# Patient Record
Sex: Male | Born: 1985 | Race: White | Hispanic: No | Marital: Single | Smoking: Current some day smoker
Health system: Southern US, Community
[De-identification: ages and names within clinical notes are randomized; demographics above are authoritative.]

## PROBLEM LIST (undated history)

## (undated) DIAGNOSIS — N200 Calculus of kidney: Secondary | ICD-10-CM

## (undated) HISTORY — DX: Calculus of kidney: N20.0

## (undated) HISTORY — PX: WISDOM TOOTH EXTRACTION: SHX21

## (undated) HISTORY — PX: HAND SURGERY: SHX662

## (undated) SURGERY — Surgical Case
Anesthesia: *Unknown

---

## 2006-07-08 ENCOUNTER — Observation Stay: Payer: Self-pay | Admitting: Unknown Physician Specialty

## 2014-11-24 ENCOUNTER — Inpatient Hospital Stay (HOSPITAL_COMMUNITY)
Admission: EM | Admit: 2014-11-24 | Discharge: 2014-11-28 | DRG: 964 | Disposition: A | Discharge status: Home / Self Care | Payer: No Typology Code available for payment source | Attending: General Surgery | Admitting: General Surgery

## 2014-11-24 ENCOUNTER — Encounter (HOSPITAL_COMMUNITY): Payer: Self-pay | Admitting: Cardiology

## 2014-11-24 ENCOUNTER — Emergency Department: Payer: Self-pay | Admitting: Emergency Medicine

## 2014-11-24 DIAGNOSIS — S2231XA Fracture of one rib, right side, initial encounter for closed fracture: Secondary | ICD-10-CM | POA: Diagnosis present

## 2014-11-24 DIAGNOSIS — S36114A Minor laceration of liver, initial encounter: Secondary | ICD-10-CM | POA: Diagnosis present

## 2014-11-24 DIAGNOSIS — S270XXA Traumatic pneumothorax, initial encounter: Secondary | ICD-10-CM | POA: Diagnosis present

## 2014-11-24 DIAGNOSIS — S36031A Moderate laceration of spleen, initial encounter: Secondary | ICD-10-CM | POA: Diagnosis present

## 2014-11-24 DIAGNOSIS — S32029A Unspecified fracture of second lumbar vertebra, initial encounter for closed fracture: Secondary | ICD-10-CM | POA: Diagnosis present

## 2014-11-24 DIAGNOSIS — S060XAA Concussion with loss of consciousness status unknown, initial encounter: Secondary | ICD-10-CM | POA: Diagnosis present

## 2014-11-24 DIAGNOSIS — S060X9A Concussion with loss of consciousness of unspecified duration, initial encounter: Secondary | ICD-10-CM | POA: Diagnosis present

## 2014-11-24 DIAGNOSIS — S36113A Laceration of liver, unspecified degree, initial encounter: Secondary | ICD-10-CM

## 2014-11-24 DIAGNOSIS — S36039A Unspecified laceration of spleen, initial encounter: Secondary | ICD-10-CM | POA: Diagnosis present

## 2014-11-24 DIAGNOSIS — S32009A Unspecified fracture of unspecified lumbar vertebra, initial encounter for closed fracture: Secondary | ICD-10-CM | POA: Diagnosis present

## 2014-11-24 DIAGNOSIS — D62 Acute posthemorrhagic anemia: Secondary | ICD-10-CM | POA: Diagnosis not present

## 2014-11-24 LAB — CBC WITH DIFFERENTIAL/PLATELET
Basophils Absolute: 0 10*3/uL (ref 0.0–0.1)
Basophils Relative: 0 % (ref 0–1)
Eosinophils Absolute: 0 10*3/uL (ref 0.0–0.7)
Eosinophils Relative: 0 % (ref 0–5)
HCT: 39.2 % (ref 39.0–52.0)
HEMOGLOBIN: 13.9 g/dL (ref 13.0–17.0)
Lymphocytes Relative: 7 % — ABNORMAL LOW (ref 12–46)
Lymphs Abs: 0.8 10*3/uL (ref 0.7–4.0)
MCH: 31.1 pg (ref 26.0–34.0)
MCHC: 35.5 g/dL (ref 30.0–36.0)
MCV: 87.7 fL (ref 78.0–100.0)
Monocytes Absolute: 0.8 10*3/uL (ref 0.1–1.0)
Monocytes Relative: 7 % (ref 3–12)
NEUTROS ABS: 10.2 10*3/uL — AB (ref 1.7–7.7)
NEUTROS PCT: 86 % — AB (ref 43–77)
PLATELETS: 166 10*3/uL (ref 150–400)
RBC: 4.47 MIL/uL (ref 4.22–5.81)
RDW: 12.3 % (ref 11.5–15.5)
WBC: 11.7 10*3/uL — ABNORMAL HIGH (ref 4.0–10.5)

## 2014-11-24 LAB — URINALYSIS, ROUTINE W REFLEX MICROSCOPIC
Bilirubin Urine: NEGATIVE
GLUCOSE, UA: NEGATIVE mg/dL
Ketones, ur: NEGATIVE mg/dL
Leukocytes, UA: NEGATIVE
Nitrite: NEGATIVE
PH: 8.5 — AB (ref 5.0–8.0)
Protein, ur: NEGATIVE mg/dL
Specific Gravity, Urine: >1.046 — ABNORMAL HIGH (ref 1.005–1.030)
Urobilinogen, UA: 1 mg/dL (ref 0.0–1.0)

## 2014-11-24 LAB — COMPREHENSIVE METABOLIC PANEL
ALK PHOS: 71 U/L (ref 39–117)
ALT: 133 U/L — ABNORMAL HIGH (ref 0–53)
AST: 139 U/L — ABNORMAL HIGH (ref 0–37)
Albumin: 3.8 g/dL (ref 3.5–5.2)
Anion gap: 8 (ref 5–15)
BILIRUBIN TOTAL: 0.4 mg/dL (ref 0.3–1.2)
BUN: 6 mg/dL (ref 6–23)
CO2: 28 mmol/L (ref 19–32)
CREATININE: 0.88 mg/dL (ref 0.50–1.35)
Calcium: 8.5 mg/dL (ref 8.4–10.5)
Chloride: 104 mmol/L (ref 96–112)
GFR calc non Af Amer: >90 mL/min (ref >90)
GLUCOSE: 107 mg/dL — AB (ref 70–99)
Potassium: 4.3 mmol/L (ref 3.5–5.1)
Sodium: 140 mmol/L (ref 135–145)
Total Protein: 6 g/dL (ref 6.0–8.3)

## 2014-11-24 LAB — CBC
HCT: 38.7 % — ABNORMAL LOW (ref 39.0–52.0)
HCT: 38.6 % — ABNORMAL LOW (ref 39.0–52.0)
Hemoglobin: 13.3 g/dL (ref 13.0–17.0)
Hemoglobin: 12.9 g/dL — ABNORMAL LOW (ref 13.0–17.0)
MCH: 30.2 pg (ref 26.0–34.0)
MCH: 29.8 pg (ref 26.0–34.0)
MCHC: 34.4 g/dL (ref 30.0–36.0)
MCHC: 33.4 g/dL (ref 30.0–36.0)
MCV: 87.8 fL (ref 78.0–100.0)
MCV: 89.1 fL (ref 78.0–100.0)
Platelets: 162 10*3/uL (ref 150–400)
Platelets: 152 10*3/uL (ref 150–400)
RBC: 4.41 MIL/uL (ref 4.22–5.81)
RBC: 4.33 MIL/uL (ref 4.22–5.81)
RDW: 12.3 % (ref 11.5–15.5)
RDW: 12.5 % (ref 11.5–15.5)
WBC: 10.1 10*3/uL (ref 4.0–10.5)
WBC: 6.8 10*3/uL (ref 4.0–10.5)

## 2014-11-24 LAB — URINE MICROSCOPIC-ADD ON

## 2014-11-24 LAB — MRSA PCR SCREENING: MRSA by PCR: NEGATIVE

## 2014-11-24 MED ORDER — SODIUM CHLORIDE 0.9 % IV SOLN: Freq: Once | INTRAVENOUS | Status: DC

## 2014-11-24 MED ORDER — HYDROMORPHONE HCL 1 MG/ML IJ SOLN
0.5000 mg | INTRAMUSCULAR | Status: DC | PRN
  Administered 2014-11-24 – 2014-11-25 (×3): 1 mg via INTRAVENOUS
  Filled 2014-11-24 (×3): qty 1

## 2014-11-24 MED ORDER — ONDANSETRON HCL 4 MG/2ML IJ SOLN
4.0000 mg | INTRAMUSCULAR | Status: DC | PRN
  Administered 2014-11-24 – 2014-11-25 (×2): 4 mg via INTRAVENOUS
  Filled 2014-11-24 (×2): qty 2

## 2014-11-24 MED ORDER — SODIUM CHLORIDE 0.9 % IV SOLN
INTRAVENOUS | Status: DC
  Administered 2014-11-24: 12:00:00 via INTRAVENOUS

## 2014-11-24 MED ORDER — PANTOPRAZOLE SODIUM 40 MG IV SOLR
40.0000 mg | Freq: Every day | INTRAVENOUS | Status: DC
  Administered 2014-11-24: 40 mg via INTRAVENOUS
  Filled 2014-11-24 (×2): qty 40

## 2014-11-24 MED ORDER — SODIUM CHLORIDE 0.9 % IV SOLN
INTRAVENOUS | Status: DC
  Filled 2014-11-24: qty 1000

## 2014-11-24 NOTE — ED Notes (Signed)
Pt attempting to give a urine sample
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
## 2014-11-24 NOTE — ED Notes (Signed)
Phlebotomy at bedside.
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
## 2014-11-24 NOTE — ED Notes (Signed)
Dr. Rancour at bedside. 
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
## 2014-11-25 LAB — CBC
HCT: 35.9 % — ABNORMAL LOW (ref 39.0–52.0)
HCT: 37.5 % — ABNORMAL LOW (ref 39.0–52.0)
Hemoglobin: 12 g/dL — ABNORMAL LOW (ref 13.0–17.0)
Hemoglobin: 12.5 g/dL — ABNORMAL LOW (ref 13.0–17.0)
MCH: 29.9 pg (ref 26.0–34.0)
MCH: 30.5 pg (ref 26.0–34.0)
MCHC: 33.4 g/dL (ref 30.0–36.0)
MCHC: 33.3 g/dL (ref 30.0–36.0)
MCV: 89.3 fL (ref 78.0–100.0)
MCV: 91.5 fL (ref 78.0–100.0)
PLATELETS: 140 10*3/uL — AB (ref 150–400)
Platelets: 131 10*3/uL — ABNORMAL LOW (ref 150–400)
RBC: 4.02 MIL/uL — AB (ref 4.22–5.81)
RBC: 4.1 MIL/uL — AB (ref 4.22–5.81)
RDW: 12.5 % (ref 11.5–15.5)
RDW: 12.4 % (ref 11.5–15.5)
WBC: 5.2 10*3/uL (ref 4.0–10.5)
WBC: 5.3 10*3/uL (ref 4.0–10.5)

## 2014-11-25 LAB — COMPREHENSIVE METABOLIC PANEL
ALBUMIN: 3.4 g/dL — AB (ref 3.5–5.2)
ALT: 95 U/L — ABNORMAL HIGH (ref 0–53)
AST: 85 U/L — AB (ref 0–37)
Alkaline Phosphatase: 61 U/L (ref 39–117)
Anion gap: 3 — ABNORMAL LOW (ref 5–15)
BUN: 6 mg/dL (ref 6–23)
CO2: 30 mmol/L (ref 19–32)
Calcium: 8.5 mg/dL (ref 8.4–10.5)
Chloride: 106 mmol/L (ref 96–112)
Creatinine, Ser: 0.93 mg/dL (ref 0.50–1.35)
GFR calc Af Amer: >90 mL/min (ref >90)
GFR calc non Af Amer: >90 mL/min (ref >90)
GLUCOSE: 87 mg/dL (ref 70–99)
Potassium: 3.8 mmol/L (ref 3.5–5.1)
Sodium: 139 mmol/L (ref 135–145)
TOTAL PROTEIN: 5.3 g/dL — AB (ref 6.0–8.3)
Total Bilirubin: 1 mg/dL (ref 0.3–1.2)

## 2014-11-25 MED ORDER — BACITRACIN ZINC 500 UNIT/GM EX OINT
TOPICAL_OINTMENT | Freq: Two times a day (BID) | CUTANEOUS | Status: DC
  Administered 2014-11-25 – 2014-11-27 (×5): 15.5556 via TOPICAL
  Administered 2014-11-28: 10:00:00 via TOPICAL
  Filled 2014-11-25 (×2): qty 28.35

## 2014-11-25 MED ORDER — HYDROCODONE-ACETAMINOPHEN 5-325 MG PO TABS
1.0000 | ORAL_TABLET | ORAL | Status: DC | PRN
  Administered 2014-11-25 – 2014-11-28 (×13): 2 via ORAL
  Filled 2014-11-25 (×13): qty 2

## 2014-11-25 MED ORDER — OXYCODONE-ACETAMINOPHEN 5-325 MG PO TABS
1.0000 | ORAL_TABLET | ORAL | Status: DC | PRN
  Administered 2014-11-25: 2 via ORAL
  Filled 2014-11-25: qty 2

## 2014-11-25 MED ORDER — PANTOPRAZOLE SODIUM 40 MG PO TBEC
40.0000 mg | DELAYED_RELEASE_TABLET | Freq: Every day | ORAL | Status: DC
  Administered 2014-11-25: 40 mg via ORAL

## 2014-11-25 NOTE — Progress Notes (Signed)
Report called to Joan RN on 6N.  
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
## 2014-11-26 DIAGNOSIS — S060X9A Concussion with loss of consciousness of unspecified duration, initial encounter: Secondary | ICD-10-CM | POA: Diagnosis present

## 2014-11-26 DIAGNOSIS — S36113A Laceration of liver, unspecified degree, initial encounter: Secondary | ICD-10-CM | POA: Diagnosis present

## 2014-11-26 DIAGNOSIS — S2231XA Fracture of one rib, right side, initial encounter for closed fracture: Secondary | ICD-10-CM | POA: Diagnosis present

## 2014-11-26 DIAGNOSIS — S060XAA Concussion with loss of consciousness status unknown, initial encounter: Secondary | ICD-10-CM | POA: Diagnosis present

## 2014-11-26 DIAGNOSIS — S32009A Unspecified fracture of unspecified lumbar vertebra, initial encounter for closed fracture: Secondary | ICD-10-CM | POA: Diagnosis present

## 2014-11-26 DIAGNOSIS — D62 Acute posthemorrhagic anemia: Secondary | ICD-10-CM | POA: Diagnosis not present

## 2014-11-26 LAB — CBC WITH DIFFERENTIAL/PLATELET
BASOS ABS: 0 10*3/uL (ref 0.0–0.1)
Basophils Relative: 0 % (ref 0–1)
Eosinophils Absolute: 0.2 10*3/uL (ref 0.0–0.7)
Eosinophils Relative: 3 % (ref 0–5)
HEMATOCRIT: 37.4 % — AB (ref 39.0–52.0)
HEMOGLOBIN: 12.6 g/dL — AB (ref 13.0–17.0)
LYMPHS ABS: 1.8 10*3/uL (ref 0.7–4.0)
Lymphocytes Relative: 38 % (ref 12–46)
MCH: 30.1 pg (ref 26.0–34.0)
MCHC: 33.7 g/dL (ref 30.0–36.0)
MCV: 89.3 fL (ref 78.0–100.0)
MONOS PCT: 10 % (ref 3–12)
Monocytes Absolute: 0.5 10*3/uL (ref 0.1–1.0)
NEUTROS ABS: 2.3 10*3/uL (ref 1.7–7.7)
NEUTROS PCT: 49 % (ref 43–77)
PLATELETS: 128 10*3/uL — AB (ref 150–400)
RBC: 4.19 MIL/uL — AB (ref 4.22–5.81)
RDW: 12.3 % (ref 11.5–15.5)
WBC: 4.8 10*3/uL (ref 4.0–10.5)

## 2014-11-26 MED ORDER — DOCUSATE SODIUM 100 MG PO CAPS
100.0000 mg | ORAL_CAPSULE | Freq: Two times a day (BID) | ORAL | Status: DC
  Administered 2014-11-26 – 2014-11-28 (×5): 100 mg via ORAL
  Filled 2014-11-26 (×5): qty 1

## 2014-11-26 MED ORDER — POLYETHYLENE GLYCOL 3350 17 G PO PACK
17.0000 g | PACK | Freq: Every day | ORAL | Status: DC
  Administered 2014-11-26 – 2014-11-27 (×2): 17 g via ORAL
  Filled 2014-11-26 (×2): qty 1

## 2014-11-26 NOTE — Progress Notes (Signed)
UR completed.  Rikayla Demmon, RN BSN [facility] CCM Trauma/Neuro ICU Case Manager [phone]  
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
## 2014-11-27 LAB — CBC
HCT: 38.6 % — ABNORMAL LOW (ref 39.0–52.0)
HEMOGLOBIN: 13 g/dL (ref 13.0–17.0)
MCH: 29.7 pg (ref 26.0–34.0)
MCHC: 33.7 g/dL (ref 30.0–36.0)
MCV: 88.3 fL (ref 78.0–100.0)
PLATELETS: 128 10*3/uL — AB (ref 150–400)
RBC: 4.37 MIL/uL (ref 4.22–5.81)
RDW: 12 % (ref 11.5–15.5)
WBC: 4.9 10*3/uL (ref 4.0–10.5)
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
## 2014-11-28 ENCOUNTER — Encounter (HOSPITAL_COMMUNITY): Admission: EM | Disposition: A | Discharge status: Home / Self Care | Payer: Self-pay | Source: Home / Self Care

## 2014-11-28 LAB — CBC
HCT: 41 % (ref 39.0–52.0)
HCT: 44.7 % (ref 39.0–52.0)
HEMOGLOBIN: 14.1 g/dL (ref 13.0–17.0)
HEMOGLOBIN: 15.8 g/dL (ref 13.0–17.0)
MCH: 30.1 pg (ref 26.0–34.0)
MCH: 31.3 pg (ref 26.0–34.0)
MCHC: 34.4 g/dL (ref 30.0–36.0)
MCHC: 35.3 g/dL (ref 30.0–36.0)
MCV: 87.4 fL (ref 78.0–100.0)
MCV: 88.5 fL (ref 78.0–100.0)
PLATELETS: 137 10*3/uL — AB (ref 150–400)
Platelets: DECREASED 10*3/uL (ref 150–400)
RBC: 4.69 MIL/uL (ref 4.22–5.81)
RBC: 5.05 MIL/uL (ref 4.22–5.81)
RDW: 12.1 % (ref 11.5–15.5)
RDW: 12.1 % (ref 11.5–15.5)
WBC: 4.9 10*3/uL (ref 4.0–10.5)
WBC: 6.1 10*3/uL (ref 4.0–10.5)

## 2014-11-28 SURGERY — LEFT HEART CATHETERIZATION WITH CORONARY ANGIOGRAM

## 2014-11-28 MED ORDER — HYDROCODONE-ACETAMINOPHEN 5-325 MG PO TABS: 1.0000 | ORAL_TABLET | ORAL | Status: DC | PRN

## 2014-11-28 NOTE — Discharge Instructions (Signed)
No running, jumping, ball or contact sports, bikes, skateboards, motorcycles, etc for 3 months. ° °No driving while taking hydrocodone. ° °
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
## 2015-08-28 DIAGNOSIS — A4151 Sepsis due to Escherichia coli [E. coli]: Secondary | ICD-10-CM | POA: Insufficient documentation

## 2015-08-28 DIAGNOSIS — R059 Cough, unspecified: Secondary | ICD-10-CM | POA: Insufficient documentation

## 2015-08-28 DIAGNOSIS — N12 Tubulo-interstitial nephritis, not specified as acute or chronic: Secondary | ICD-10-CM | POA: Insufficient documentation

## 2019-02-09 ENCOUNTER — Encounter: Payer: Self-pay | Admitting: Emergency Medicine

## 2019-02-09 ENCOUNTER — Ambulatory Visit
Admission: EM | Admit: 2019-02-09 | Discharge: 2019-02-09 | Disposition: A | Discharge status: Home / Self Care | Payer: Self-pay | Attending: Family Medicine | Admitting: Family Medicine

## 2019-02-09 ENCOUNTER — Other Ambulatory Visit: Payer: Self-pay

## 2019-02-09 DIAGNOSIS — B359 Dermatophytosis, unspecified: Secondary | ICD-10-CM

## 2019-02-09 MED ORDER — FLUCONAZOLE 150 MG PO TABS: 150.0000 mg | ORAL_TABLET | ORAL | 0 refills | Status: DC

## 2019-02-09 NOTE — ED Triage Notes (Signed)
Patient c/o rash to left inner thigh that started 2 months ago. Patient states the rash does itch and he has used Lotrimin and Desitin.
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
## 2019-02-09 NOTE — Discharge Instructions (Signed)
Medication as prescribed.  Take care  Dr. Austine Wiedeman  

## 2019-09-05 ENCOUNTER — Emergency Department
Admission: EM | Admit: 2019-09-05 | Discharge: 2019-09-05 | Disposition: A | Discharge status: Home / Self Care | Payer: Self-pay | Attending: Student in an Organized Health Care Education/Training Program | Admitting: Student in an Organized Health Care Education/Training Program

## 2019-09-05 ENCOUNTER — Other Ambulatory Visit: Payer: Self-pay

## 2019-09-05 ENCOUNTER — Encounter: Payer: Self-pay | Admitting: Emergency Medicine

## 2019-09-05 ENCOUNTER — Emergency Department: Payer: Self-pay

## 2019-09-05 DIAGNOSIS — Z79899 Other long term (current) drug therapy: Secondary | ICD-10-CM | POA: Insufficient documentation

## 2019-09-05 DIAGNOSIS — Z87891 Personal history of nicotine dependence: Secondary | ICD-10-CM | POA: Insufficient documentation

## 2019-09-05 DIAGNOSIS — N201 Calculus of ureter: Secondary | ICD-10-CM | POA: Insufficient documentation

## 2019-09-05 DIAGNOSIS — R109 Unspecified abdominal pain: Secondary | ICD-10-CM | POA: Insufficient documentation

## 2019-09-05 DIAGNOSIS — F1210 Cannabis abuse, uncomplicated: Secondary | ICD-10-CM | POA: Insufficient documentation

## 2019-09-05 LAB — URINALYSIS, COMPLETE (UACMP) WITH MICROSCOPIC
Bacteria, UA: NONE SEEN
Bilirubin Urine: NEGATIVE
Glucose, UA: NEGATIVE mg/dL
Ketones, ur: 5 mg/dL — AB
Leukocytes,Ua: NEGATIVE
Nitrite: NEGATIVE
Protein, ur: 100 mg/dL — AB
RBC / HPF: >50 RBC/hpf — ABNORMAL HIGH (ref 0–5)
Specific Gravity, Urine: 1.03 (ref 1.005–1.030)
pH: 5 (ref 5.0–8.0)

## 2019-09-05 LAB — CBC
HCT: 42.7 % (ref 39.0–52.0)
Hemoglobin: 14.3 g/dL (ref 13.0–17.0)
MCH: 30 pg (ref 26.0–34.0)
MCHC: 33.5 g/dL (ref 30.0–36.0)
MCV: 89.5 fL (ref 80.0–100.0)
Platelets: 173 10*3/uL (ref 150–400)
RBC: 4.77 MIL/uL (ref 4.22–5.81)
RDW: 12.1 % (ref 11.5–15.5)
WBC: 5.8 10*3/uL (ref 4.0–10.5)
nRBC: 0 % (ref 0.0–0.2)

## 2019-09-05 LAB — BASIC METABOLIC PANEL
Anion gap: 10 (ref 5–15)
BUN: 13 mg/dL (ref 6–20)
CO2: 24 mmol/L (ref 22–32)
Calcium: 9.1 mg/dL (ref 8.9–10.3)
Chloride: 105 mmol/L (ref 98–111)
Creatinine, Ser: 1.11 mg/dL (ref 0.61–1.24)
GFR calc Af Amer: >60 mL/min (ref >60)
GFR calc non Af Amer: >60 mL/min (ref >60)
Glucose, Bld: 109 mg/dL — ABNORMAL HIGH (ref 70–99)
Potassium: 4.1 mmol/L (ref 3.5–5.1)
Sodium: 139 mmol/L (ref 135–145)

## 2019-09-05 MED ORDER — HYDROCODONE-ACETAMINOPHEN 5-325 MG PO TABS: 1.0000 | ORAL_TABLET | ORAL | 0 refills | Status: DC | PRN

## 2019-09-05 MED ORDER — ONDANSETRON 4 MG PO TBDP
4.0000 mg | ORAL_TABLET | Freq: Once | ORAL | Status: AC
  Administered 2019-09-05: 4 mg via ORAL
  Filled 2019-09-05: qty 1

## 2019-09-05 MED ORDER — MORPHINE SULFATE (PF) 4 MG/ML IV SOLN
4.0000 mg | INTRAVENOUS | Status: DC | PRN
  Administered 2019-09-05: 4 mg via INTRAVENOUS
  Filled 2019-09-05: qty 1

## 2019-09-05 MED ORDER — SODIUM CHLORIDE 0.9 % IV BOLUS
1000.0000 mL | Freq: Once | INTRAVENOUS | Status: AC
  Administered 2019-09-05: 1000 mL via INTRAVENOUS

## 2019-09-05 MED ORDER — FLUCONAZOLE 150 MG PO TABS: 150.0000 mg | ORAL_TABLET | ORAL | 0 refills | Status: AC

## 2019-09-05 MED ORDER — KETOROLAC TROMETHAMINE 30 MG/ML IJ SOLN
15.0000 mg | Freq: Once | INTRAMUSCULAR | Status: AC
  Administered 2019-09-05: 15 mg via INTRAVENOUS
  Filled 2019-09-05: qty 1

## 2019-09-05 MED ORDER — ONDANSETRON HCL 4 MG PO TABS: 4.0000 mg | ORAL_TABLET | Freq: Every day | ORAL | 0 refills | Status: AC | PRN

## 2019-09-05 MED ORDER — TAMSULOSIN HCL 0.4 MG PO CAPS: 0.4000 mg | ORAL_CAPSULE | Freq: Every day | ORAL | 0 refills | Status: AC

## 2019-09-05 NOTE — ED Notes (Signed)
ED Provider at bedside. 

## 2019-09-05 NOTE — ED Triage Notes (Addendum)
Left lower back pain that sometimes radiates to abdomen.  Onset of symptoms this morning.  States pain is intermittently more intense.   Denies injury.
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
## 2019-09-13 ENCOUNTER — Ambulatory Visit
Admission: RE | Admit: 2019-09-13 | Discharge: 2019-09-13 | Disposition: A | Discharge status: Home / Self Care | Payer: Self-pay | Source: Ambulatory Visit | Attending: Urology | Admitting: Urology

## 2019-09-13 ENCOUNTER — Ambulatory Visit (INDEPENDENT_AMBULATORY_CARE_PROVIDER_SITE_OTHER): Payer: Self-pay | Admitting: Urology

## 2019-09-13 ENCOUNTER — Encounter: Payer: Self-pay | Admitting: Urology

## 2019-09-13 ENCOUNTER — Other Ambulatory Visit: Payer: Self-pay

## 2019-09-13 VITALS — BP 116/74 | HR 87 | Ht 72.0 in | Wt 126.1 lb

## 2019-09-13 DIAGNOSIS — N23 Unspecified renal colic: Secondary | ICD-10-CM | POA: Insufficient documentation

## 2019-09-13 DIAGNOSIS — N201 Calculus of ureter: Secondary | ICD-10-CM | POA: Insufficient documentation

## 2019-09-13 DIAGNOSIS — N200 Calculus of kidney: Secondary | ICD-10-CM | POA: Insufficient documentation

## 2019-09-13 DIAGNOSIS — R933 Abnormal findings on diagnostic imaging of other parts of digestive tract: Secondary | ICD-10-CM | POA: Insufficient documentation

## 2019-09-13 MED ORDER — HYDROCODONE-ACETAMINOPHEN 5-325 MG PO TABS: 1.0000 | ORAL_TABLET | ORAL | 0 refills | Status: DC | PRN

## 2019-09-13 MED ORDER — NYSTATIN-TRIAMCINOLONE 100000-0.1 UNIT/GM-% EX OINT: 1.0000 "application " | TOPICAL_OINTMENT | Freq: Two times a day (BID) | CUTANEOUS | 0 refills | Status: AC

## 2019-09-13 NOTE — Progress Notes (Signed)
09/13/2019 10:19 AM   [name] 05-Jul-1986 [id number]  Referring provider: No referring provider defined for this encounter.  Chief Complaint  Patient presents with  . Hospitalization Follow-up    HPI: Dave Giles is a 33 y.o. male who presented to the [facility] ED on 09/05/2019 with a 1 day history of intermittent left flank pain.  The pain was described as severe without identifiable precipitating, aggravating or alleviating factors.  The pain was nonradiating.  Stone protocol CT in the ED was performed which was remarkable for 7 mm left mid ureteral calculus with mild hydronephrosis/hydroureter.  His symptoms were controlled with p.o. analgesics and he was discharged on oral hydrocodone, tamsulosin and Zofran.  He was also given fluconazole for what was felt to be tinea cruris. He does not think this medication has been effective.  Since his ED visit he notes intermittent mild-moderate pain but no severe renal colic.  He is out of hydrocodone.  Denies previous history of stone disease but thinks he may have passed a stone several years ago.  PMH: Past Medical History:  Diagnosis Date  . Kidney stone     Surgical History: Past Surgical History:  Procedure Laterality Date  . HAND SURGERY     Right hand with pins  . WISDOM TOOTH EXTRACTION      Home Medications:  Allergies as of 09/13/2019   No Known Allergies     Medication List       Accurate as of September 13, 2019 10:19 AM. If you have any questions, ask your nurse or doctor.        fluconazole 150 MG tablet Commonly known as: Diflucan Take 1 tablet (150 mg total) by mouth every 3 (three) days. What changed: Another medication with the same name was removed. Continue taking this medication, and follow the directions you see here. Changed by: Riki Altes, MD   HYDROcodone-acetaminophen 5-325 MG tablet Commonly known as: Norco Take 1 tablet by mouth every 4 (four) hours as needed for moderate  pain or severe pain.   ondansetron 4 MG tablet Commonly known as: Zofran Take 1 tablet (4 mg total) by mouth daily as needed.   tamsulosin 0.4 MG Caps capsule Commonly known as: FLOMAX Take 1 capsule (0.4 mg total) by mouth daily after supper.       Allergies: No Known Allergies  Family History: No family history on file.  Social History:  reports that he has quit smoking. He has never used smokeless tobacco. He reports previous alcohol use. He reports current drug use. Drug: Marijuana.  ROS: UROLOGY Frequent Urination?: No Hard to postpone urination?: No Burning/pain with urination?: No Get up at night to urinate?: No Leakage of urine?: No Urine stream starts and stops?: No Trouble starting stream?: No Do you have to strain to urinate?: No Blood in urine?: No Urinary tract infection?: No Sexually transmitted disease?: No Injury to kidneys or bladder?: No Painful intercourse?: No Weak stream?: No Erection problems?: No Penile pain?: No  Gastrointestinal Nausea?: Yes Vomiting?: No Indigestion/heartburn?: No Diarrhea?: No Constipation?: No  Constitutional Fever: No Night sweats?: No Weight loss?: No Fatigue?: No  Skin Skin rash/lesions?: No Itching?: No  Eyes Blurred vision?: No Double vision?: No  Ears/Nose/Throat Sore throat?: No Sinus problems?: No  Hematologic/Lymphatic Swollen glands?: No Easy bruising?: No  Cardiovascular Leg swelling?: No Chest pain?: No  Respiratory Cough?: No Shortness of breath?: No  Endocrine Excessive thirst?: No  Musculoskeletal Back pain?: No Joint pain?: No  Neurological Headaches?: No Dizziness?: No  Psychologic Depression?: No Anxiety?: No  Physical Exam: BP 116/74 (BP Location: Left Arm, Patient Position: Sitting, Cuff Size: Normal)   Pulse 87   Ht 6' (1.829 m)   Wt 126 lb 1.6 oz (57.2 kg)   BMI 17.10 kg/m   Constitutional:  Alert and oriented, No acute distress. HEENT: [place] AT, moist  mucus membranes.  Trachea midline, no masses. Cardiovascular: No clubbing, cyanosis, or edema.  RRR Respiratory: Normal respiratory effort, no increased work of breathing.  Clear GI: Abdomen is soft, nontender, nondistended, no abdominal masses GU: Phallus circumcised without lesions, testes descended bilaterally without masses or tenderness.  Mild to moderate left hemiscrotal erythema. Lymph: No cervical or inguinal lymphadenopathy. Skin: No rashes, bruises or suspicious lesions. Neurologic: Grossly intact, no focal deficits, moving all 4 extremities. Psychiatric: Normal mood and affect.   Pertinent Imaging: CT images were personally reviewed  Results for orders placed during the hospital encounter of 09/05/19  CT Renal Stone Study   Narrative CLINICAL DATA:  33 year old male with history of left lower back pain radiating into the abdomen. Suspected stone disease.  EXAM: CT ABDOMEN AND PELVIS WITHOUT CONTRAST  TECHNIQUE: Multidetector CT imaging of the abdomen and pelvis was performed following the standard protocol without IV contrast.  COMPARISON:  CT the chest, abdomen and pelvis 11/24/2014.  FINDINGS: Lower chest: Unremarkable.  Hepatobiliary: No definite suspicious cystic or solid hepatic lesions are confidently identified on today's noncontrast CT examination. Unenhanced appearance of the gallbladder is normal.  Pancreas: No definite pancreatic mass or peripancreatic fluid collections or inflammatory changes noted on today's noncontrast CT examination.  Spleen: Unremarkable.  Adrenals/Urinary Tract: Tiny nonobstructive calculi are present within the left renal collecting system, largest of which measures 4 mm in the lower pole collecting system of left kidney. In addition, in the mid left ureter there is a 7 mm calculus which is associated with mild proximal left hydroureteronephrosis. No right ureteral or bladder calculi are noted. Unenhanced appearance of the  kidneys and bilateral adrenal glands are otherwise unremarkable. Urinary bladder is normal in appearance.  Stomach/Bowel: Unenhanced appearance of the stomach is normal. No pathologic dilatation of small bowel or colon. Mural thickening throughout the entire rectum. The appendix is not confidently identified and may be surgically absent. Regardless, there are no inflammatory changes noted adjacent to the cecum to suggest the presence of an acute appendicitis at this time.  Vascular/Lymphatic: No atherosclerotic calcifications in the abdominal aorta or pelvic vasculature. No lymphadenopathy noted in the abdomen or pelvis.  Reproductive: Prostate gland and seminal vesicles are unremarkable in appearance.  Other: No significant volume of ascites.  No pneumoperitoneum.  Musculoskeletal: There are no aggressive appearing lytic or blastic lesions noted in the visualized portions of the skeleton.  IMPRESSION: 1. In addition a nonobstructive calculi in the left renal collecting system measuring up to 4 mm in the lower pole, there is a 7 mm calculus in the middle third of the left ureter with mild proximal left hydroureteronephrosis indicative of mild obstruction at this time. 2. Diffuse mural thickening throughout the rectum. This may simply represent proctitis, however, the possibility of infiltrative rectal neoplasm is not entirely excluded. Correlation with clinical history and physical examination is recommended, with consideration for potential nonemergent colonoscopy for follow-up to exclude the possibility of rectal neoplasm.   Electronically Signed   By: Vinnie Langton M.D.   On: 09/05/2019 13:58     Assessment & Plan:    - Left  ureteral calculus Continues with intermittent mild to moderate pain.  Hydrocodone was refilled.  KUB was ordered to see if there has been distal stone progression.  Based on stone size there is a less than 50% chance that the stone will pass  spontaneously.  Management options were discussed including continued medical expulsion therapy, shockwave lithotripsy and ureteroscopic removal.  He will be notified with his KUB results and further recommendations.  - Nephrolithiasis Nonobstructing left renal calculi  - Scrotal erythema Trial Mycolog cream.  - Rectal findings on CT Recommend gastroenterology referral.    Riki AltesScott C Dalisa Forrer, MD  [facility] [address], [address] [place], [place] [address] [phone]

## 2019-09-18 ENCOUNTER — Telehealth: Payer: Self-pay | Admitting: Urology

## 2019-09-18 DIAGNOSIS — R933 Abnormal findings on diagnostic imaging of other parts of digestive tract: Secondary | ICD-10-CM

## 2019-09-18 NOTE — Telephone Encounter (Signed)
KUB was reviewed and the stone was not visualized.  If he is still symptomatic the best course of action would be to schedule left ureteroscopy.  If his symptoms have resolved would recommend a follow-up renal ultrasound.  He was incidentally noted to have rectal wall thickening on CT and the radiologist mentioned the possibility of cancer could not be excluded.  I have placed a gastroenterology consult for further evaluation.

## 2019-09-20 ENCOUNTER — Encounter: Payer: Self-pay | Admitting: Urology

## 2019-09-20 NOTE — Telephone Encounter (Signed)
Dave Giles, would you mind dictating a letter recommending follow-up and touching on loss of kidney function from an obstructing asymptomatic stone?  He also had rectal thickening on CT and radiology said cancer could not be excluded.

## 2019-09-20 NOTE — Telephone Encounter (Signed)
I can dictate a letter.

## 2019-09-20 NOTE — Telephone Encounter (Signed)
Called pt informed him of the information below. Pt states that he would like to just wait to see of stone passes, he is not having any symptoms at this time. He declines further work up, including u/s. Pt states that he has been contacted by GI and has declined that appointment.

## 2019-10-27 ENCOUNTER — Telehealth: Payer: Self-pay | Admitting: Urology

## 2019-10-27 NOTE — Telephone Encounter (Signed)
Patient was referred to [facility] and he declined to schedule for the following reason Called pt he states he would like to pass on scheduling an apt he states he was not too worried about it. I asked if he was sure he states he was.   Thanks, Marcelino Duster

## 2019-10-29 ENCOUNTER — Other Ambulatory Visit: Payer: Self-pay

## 2019-10-29 ENCOUNTER — Encounter: Payer: Self-pay | Admitting: Intensive Care

## 2019-10-29 ENCOUNTER — Emergency Department
Admission: EM | Admit: 2019-10-29 | Discharge: 2019-10-29 | Disposition: A | Discharge status: Home / Self Care | Payer: Self-pay | Attending: Emergency Medicine | Admitting: Emergency Medicine

## 2019-10-29 DIAGNOSIS — N201 Calculus of ureter: Secondary | ICD-10-CM | POA: Insufficient documentation

## 2019-10-29 DIAGNOSIS — N3090 Cystitis, unspecified without hematuria: Secondary | ICD-10-CM | POA: Insufficient documentation

## 2019-10-29 DIAGNOSIS — Z79899 Other long term (current) drug therapy: Secondary | ICD-10-CM | POA: Insufficient documentation

## 2019-10-29 DIAGNOSIS — F17290 Nicotine dependence, other tobacco product, uncomplicated: Secondary | ICD-10-CM | POA: Insufficient documentation

## 2019-10-29 HISTORY — DX: Calculus of kidney: N20.0

## 2019-10-29 LAB — URINALYSIS, COMPLETE (UACMP) WITH MICROSCOPIC
Bacteria, UA: NONE SEEN
Specific Gravity, Urine: 1.011 (ref 1.005–1.030)

## 2019-10-29 LAB — BASIC METABOLIC PANEL
Anion gap: 8 (ref 5–15)
BUN: 12 mg/dL (ref 6–20)
CO2: 30 mmol/L (ref 22–32)
Calcium: 9.2 mg/dL (ref 8.9–10.3)
Chloride: 102 mmol/L (ref 98–111)
Creatinine, Ser: 0.93 mg/dL (ref 0.61–1.24)
GFR calc Af Amer: >60 mL/min (ref >60)
GFR calc non Af Amer: >60 mL/min (ref >60)
Glucose, Bld: 90 mg/dL (ref 70–99)
Potassium: 3.9 mmol/L (ref 3.5–5.1)
Sodium: 140 mmol/L (ref 135–145)

## 2019-10-29 LAB — CBC
HCT: 41.5 % (ref 39.0–52.0)
Hemoglobin: 14.1 g/dL (ref 13.0–17.0)
MCH: 30.5 pg (ref 26.0–34.0)
MCHC: 34 g/dL (ref 30.0–36.0)
MCV: 89.8 fL (ref 80.0–100.0)
Platelets: 178 10*3/uL (ref 150–400)
RBC: 4.62 MIL/uL (ref 4.22–5.81)
RDW: 12.2 % (ref 11.5–15.5)
WBC: 5.3 10*3/uL (ref 4.0–10.5)
nRBC: 0 % (ref 0.0–0.2)

## 2019-10-29 MED ORDER — KETOROLAC TROMETHAMINE 30 MG/ML IJ SOLN
30.0000 mg | Freq: Once | INTRAMUSCULAR | Status: AC
  Administered 2019-10-29: 30 mg via INTRAVENOUS
  Filled 2019-10-29: qty 1

## 2019-10-29 MED ORDER — CEPHALEXIN 500 MG PO CAPS: 500.0000 mg | ORAL_CAPSULE | Freq: Two times a day (BID) | ORAL | 0 refills | Status: AC

## 2019-10-29 MED ORDER — HYDROCODONE-ACETAMINOPHEN 5-325 MG PO TABS: 1.0000 | ORAL_TABLET | ORAL | 0 refills | Status: AC | PRN

## 2019-10-29 MED ORDER — SODIUM CHLORIDE 0.9 % IV BOLUS
1000.0000 mL | Freq: Once | INTRAVENOUS | Status: AC
  Administered 2019-10-29: 1000 mL via INTRAVENOUS

## 2019-10-29 NOTE — ED Notes (Signed)
First Nurse Note: pt to ED via POV stating that he is having left flank pain, thinks he may have a kidney stone. Pt is in NAD.

## 2019-10-29 NOTE — ED Triage Notes (Signed)
Patient c/o left sided flank pain that started this AM. Hx kidney stones

## 2019-10-29 NOTE — Discharge Instructions (Signed)
Take the antibiotic as prescribed and finish the full 10-day course.  You may take the Norco as needed for pain, however you should switch to over-the-counter medicine (primarily ibuprofen) as soon as you are able to.  Make an appointment to follow-up with the urologist next week.  Return to the ER for new, persistent, or worsening flank or abdominal pain, fever, persistent blood in the urine or any other new or worsening symptoms that concern you.
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
## 2019-10-31 ENCOUNTER — Telehealth: Payer: Self-pay | Admitting: Urology

## 2019-10-31 DIAGNOSIS — N201 Calculus of ureter: Secondary | ICD-10-CM

## 2019-10-31 NOTE — Telephone Encounter (Signed)
He was seen back in the ER last week for recurrent left flank pain meaning his stone is still present.  Recommend a follow-up KUB to see if the stone has progressed distally.  Order was entered and will call with results.

## 2019-11-01 NOTE — Telephone Encounter (Signed)
Spoke with patient and he will go get the KUB  [place]

## 2019-11-07 ENCOUNTER — Other Ambulatory Visit: Payer: Self-pay

## 2019-11-07 ENCOUNTER — Ambulatory Visit
Admission: RE | Admit: 2019-11-07 | Discharge: 2019-11-07 | Disposition: A | Discharge status: Home / Self Care | Payer: Self-pay | Source: Ambulatory Visit | Attending: Urology | Admitting: Urology

## 2019-11-07 DIAGNOSIS — N201 Calculus of ureter: Secondary | ICD-10-CM

## 2019-11-07 NOTE — Telephone Encounter (Signed)
Pt called and states that he is  still in pain but has not gotten his KUB yet. I recommended that he go ahead and get KUB so that his results can be read. He states that he will go today and get it.

## 2019-11-08 NOTE — Telephone Encounter (Signed)
KUB was reviewed and there is a calcification in the pelvis which was not seen on the previous KUB which most likely represents progression of his stone.  Options would be to continue a trial of passage or schedule ureteroscopic removal.

## 2019-11-08 NOTE — Telephone Encounter (Signed)
Patient would like to have ureteroscopic done .

## 2019-11-09 ENCOUNTER — Telehealth: Payer: Self-pay | Admitting: Radiology

## 2019-11-09 NOTE — Telephone Encounter (Signed)
Scheduling sheet completed and I will give to Amy

## 2019-11-09 NOTE — Telephone Encounter (Signed)
Patient feels he may have passed the kidney stone. He was able to retrieve half of it and reports it was approximately 1/8 inch long. He also reports "hardly any pain and I've been able to urinate without having to push like I was having to". Please advise.

## 2019-11-09 NOTE — Telephone Encounter (Signed)
Will get a follow-up KUB early next week

## 2019-11-10 ENCOUNTER — Other Ambulatory Visit: Payer: Self-pay | Admitting: Radiology

## 2019-11-10 DIAGNOSIS — N201 Calculus of ureter: Secondary | ICD-10-CM

## 2019-11-10 NOTE — Telephone Encounter (Signed)
Advised patient to get a KUB early next week. Patient verbalizes understanding.
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
## 2019-11-13 ENCOUNTER — Ambulatory Visit
Admission: RE | Admit: 2019-11-13 | Discharge: 2019-11-13 | Disposition: A | Discharge status: Home / Self Care | Payer: Self-pay | Source: Ambulatory Visit | Attending: Urology | Admitting: Urology

## 2019-11-13 DIAGNOSIS — N201 Calculus of ureter: Secondary | ICD-10-CM

## 2019-11-20 ENCOUNTER — Telehealth: Payer: Self-pay | Admitting: Radiology

## 2019-11-20 NOTE — Telephone Encounter (Signed)
-----   Message from Riki Altes, MD sent at 11/19/2019  8:16 PM EST ----- Regarding: RE: ureteral calculus If still having sxs recc a follow up CT  ----- Message ----- From: Nicolasa Ducking, RN Sent: 11/14/2019   4:30 PM EST To: Riki Altes, MD Subject: ureteral calculus                              Patient reports non-severe pain in bladder area that is similar to how he felt before passing the previous stone. Please advise. ----- Message ----- From: Riki Altes, MD Sent: 11/14/2019   3:12 PM EST To: Nicolasa Ducking, RN  KUB was reviewed.  I do not see the previously notified stone.  If he remains asymptomatic do not need to schedule ureteroscopy

## 2019-11-20 NOTE — Telephone Encounter (Signed)
Patient reports passing another stone. Denies pain since. Advised patient to call back if he becomes symptomatic again for recommended CT. Patient verbalized understanding.

## 2020-02-18 IMAGING — CR DG ABDOMEN 1V
2 series · 2 of 2 positions shown · non-contrast
Comparison: CT abdomen/pelvis dated 09/05/2019

CLINICAL DATA: Left ureteral calculus, kidney stone

EXAM:
ABDOMEN - 1 VIEW

[abdomen kub (1 of 2)]
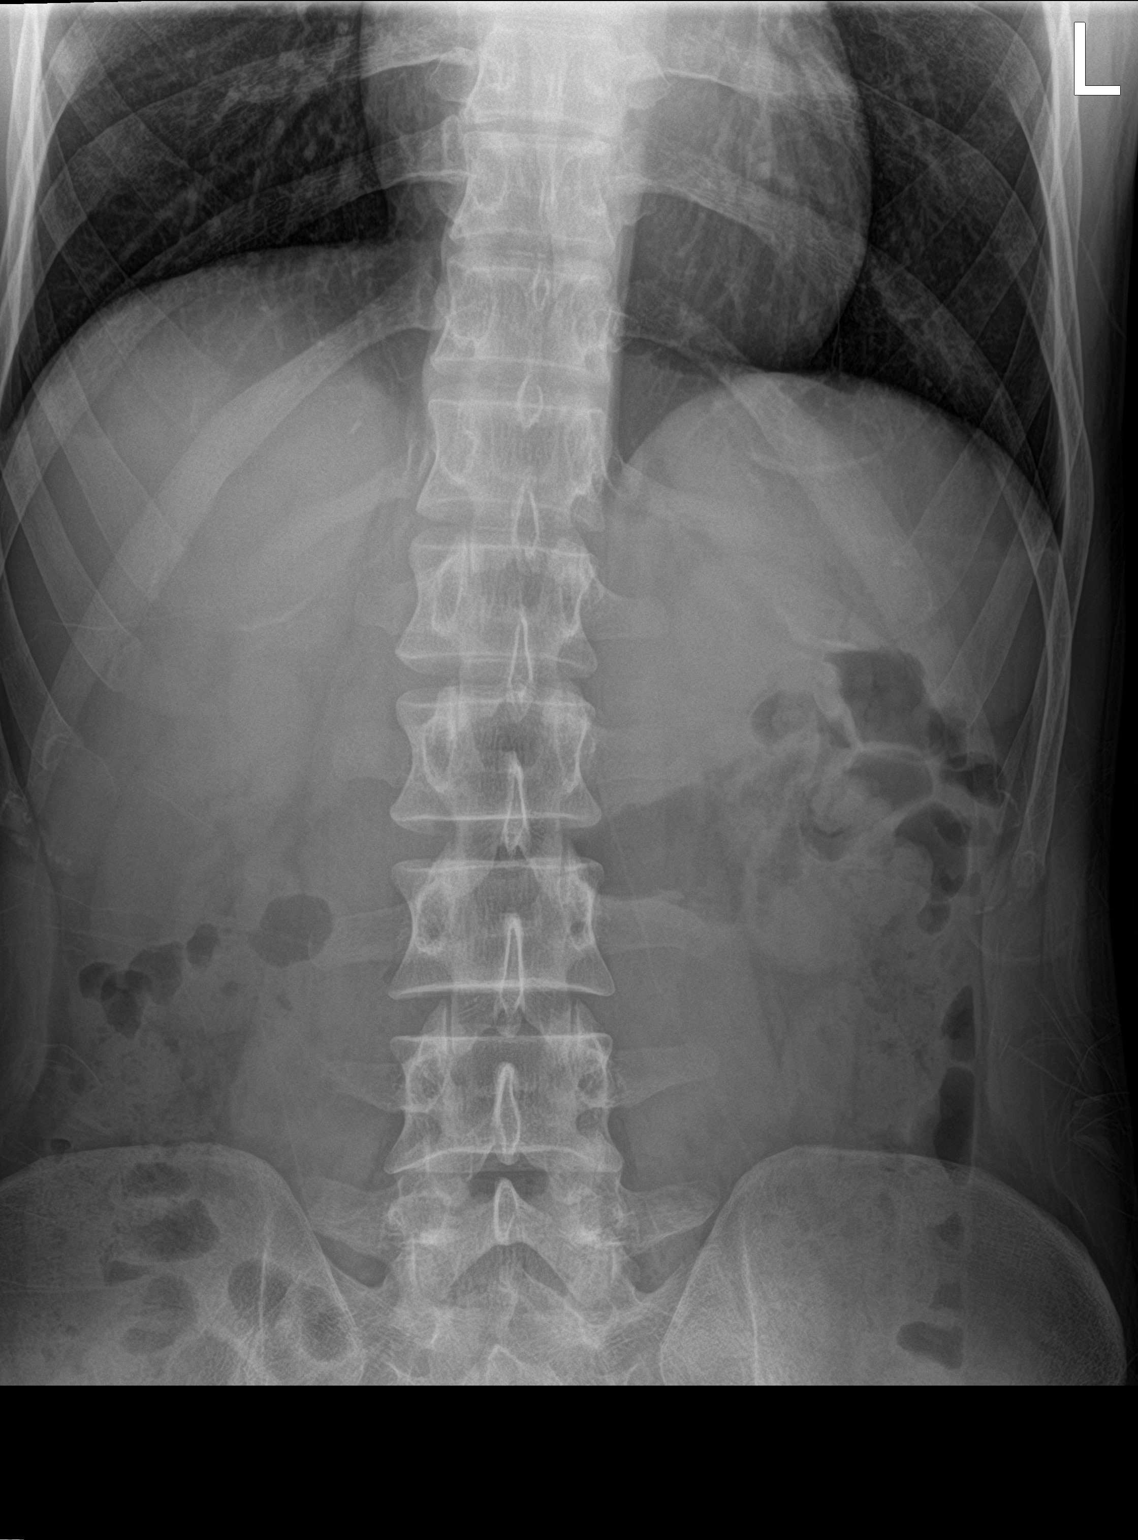

[abdomen kub (2 of 2)]
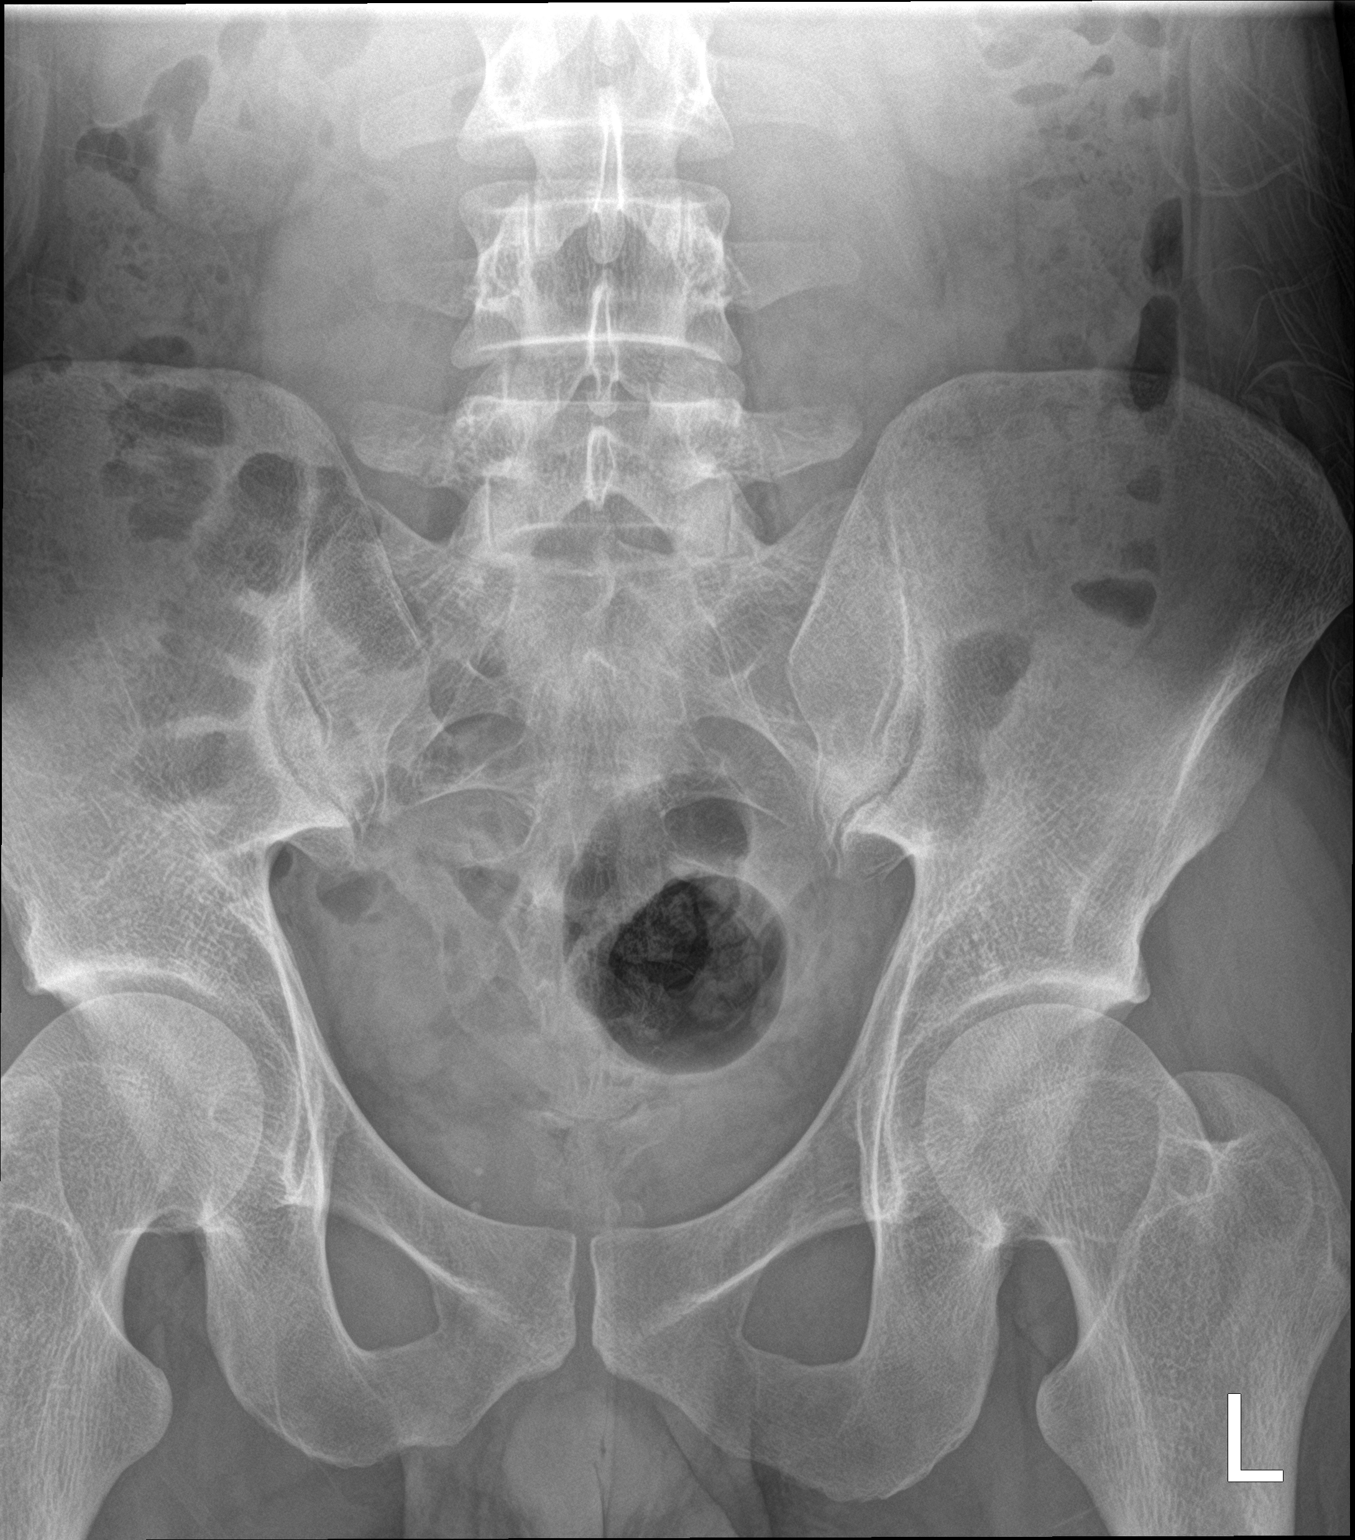

[2 of 2 positions shown; findings below may reference images not displayed]

FINDINGS: No definite renal or ureteral calculi are radiographically evident.

Specifically, the left lower pole renal calculus and distal left
ureteral calculus on recent CT are not visualized.
IMPRESSION: Recent left renal/ureteral calculi are not radiographically evident.

## 2020-04-08 NOTE — Progress Notes (Signed)
Certified Letter  

## 2020-04-13 IMAGING — CR DG ABDOMEN 1V
1 series · 2 of 2 positions shown · non-contrast
Comparison: 09/13/2019

CLINICAL DATA: Bladder pain for 1 month, history of kidney stones

EXAM:
ABDOMEN - 1 VIEW

[Series 1: dg abd 1 view · 0.14mm/px · 2 of 2 slices shown]
[im 1/2]
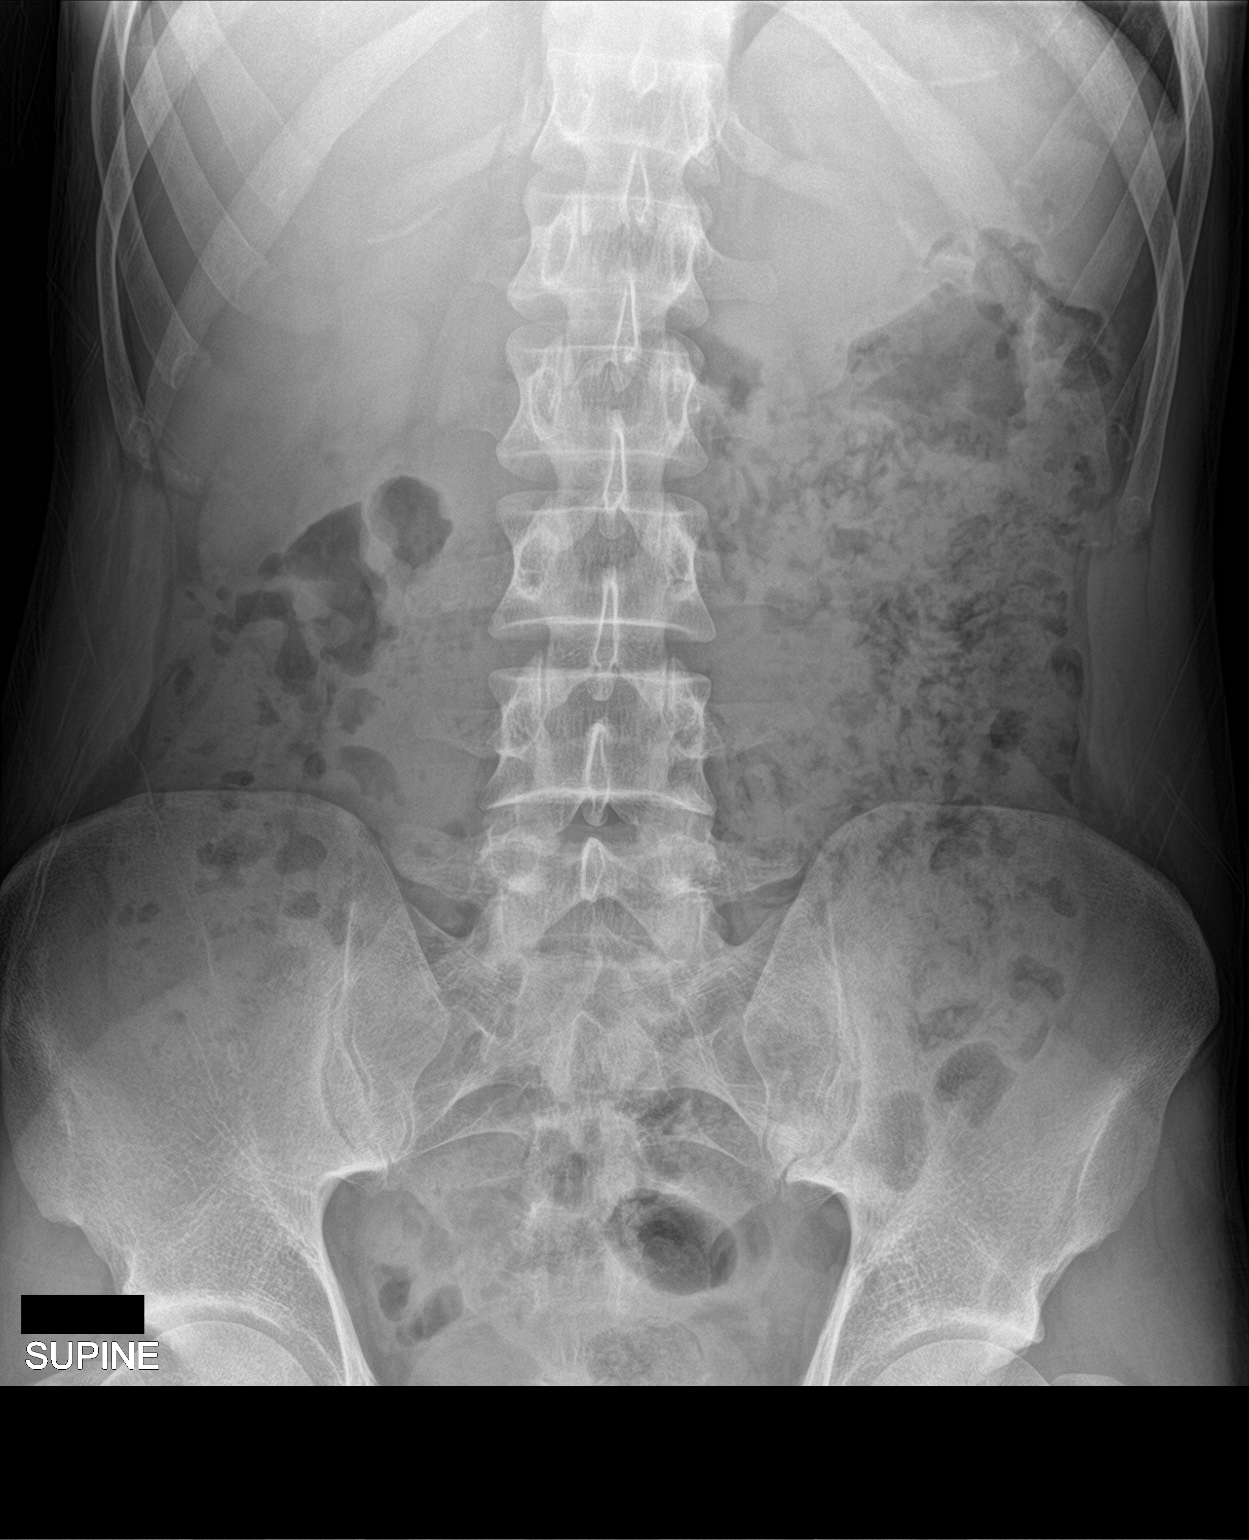
[im 2/2]
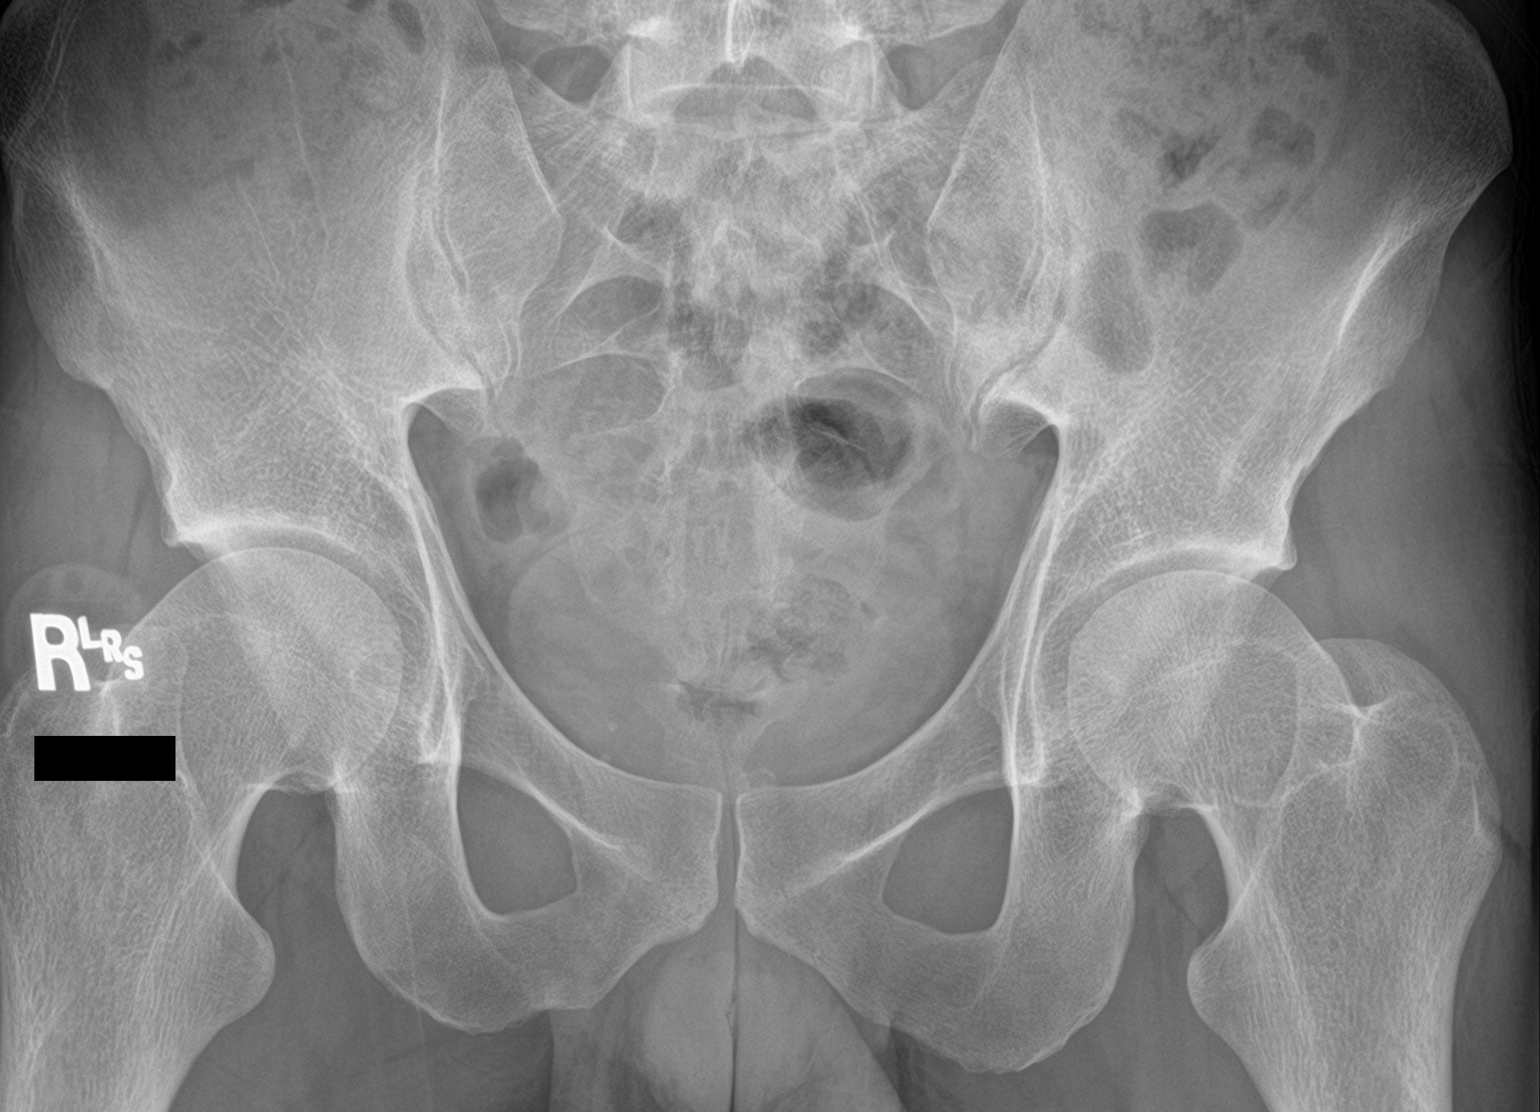

[2 of 2 positions shown; findings below may reference images not displayed]

FINDINGS: There is a moderate amount of stool throughout the colon. There is
no bowel dilatation to suggest obstruction. There is no evidence of
pneumoperitoneum, portal venous gas or pneumatosis.

There are no pathologic calcifications along the expected course of
the ureters.

The osseous structures are unremarkable.
IMPRESSION: Moderate amount of stool throughout the colon.

## 2020-04-19 IMAGING — CR DG ABDOMEN 1V
2 series · 2 of 2 positions shown · non-contrast
Comparison: 11/07/2019

CLINICAL DATA: Left ureteral calculus.

EXAM:
ABDOMEN - 1 VIEW

[abdomen kub (1 of 2)]
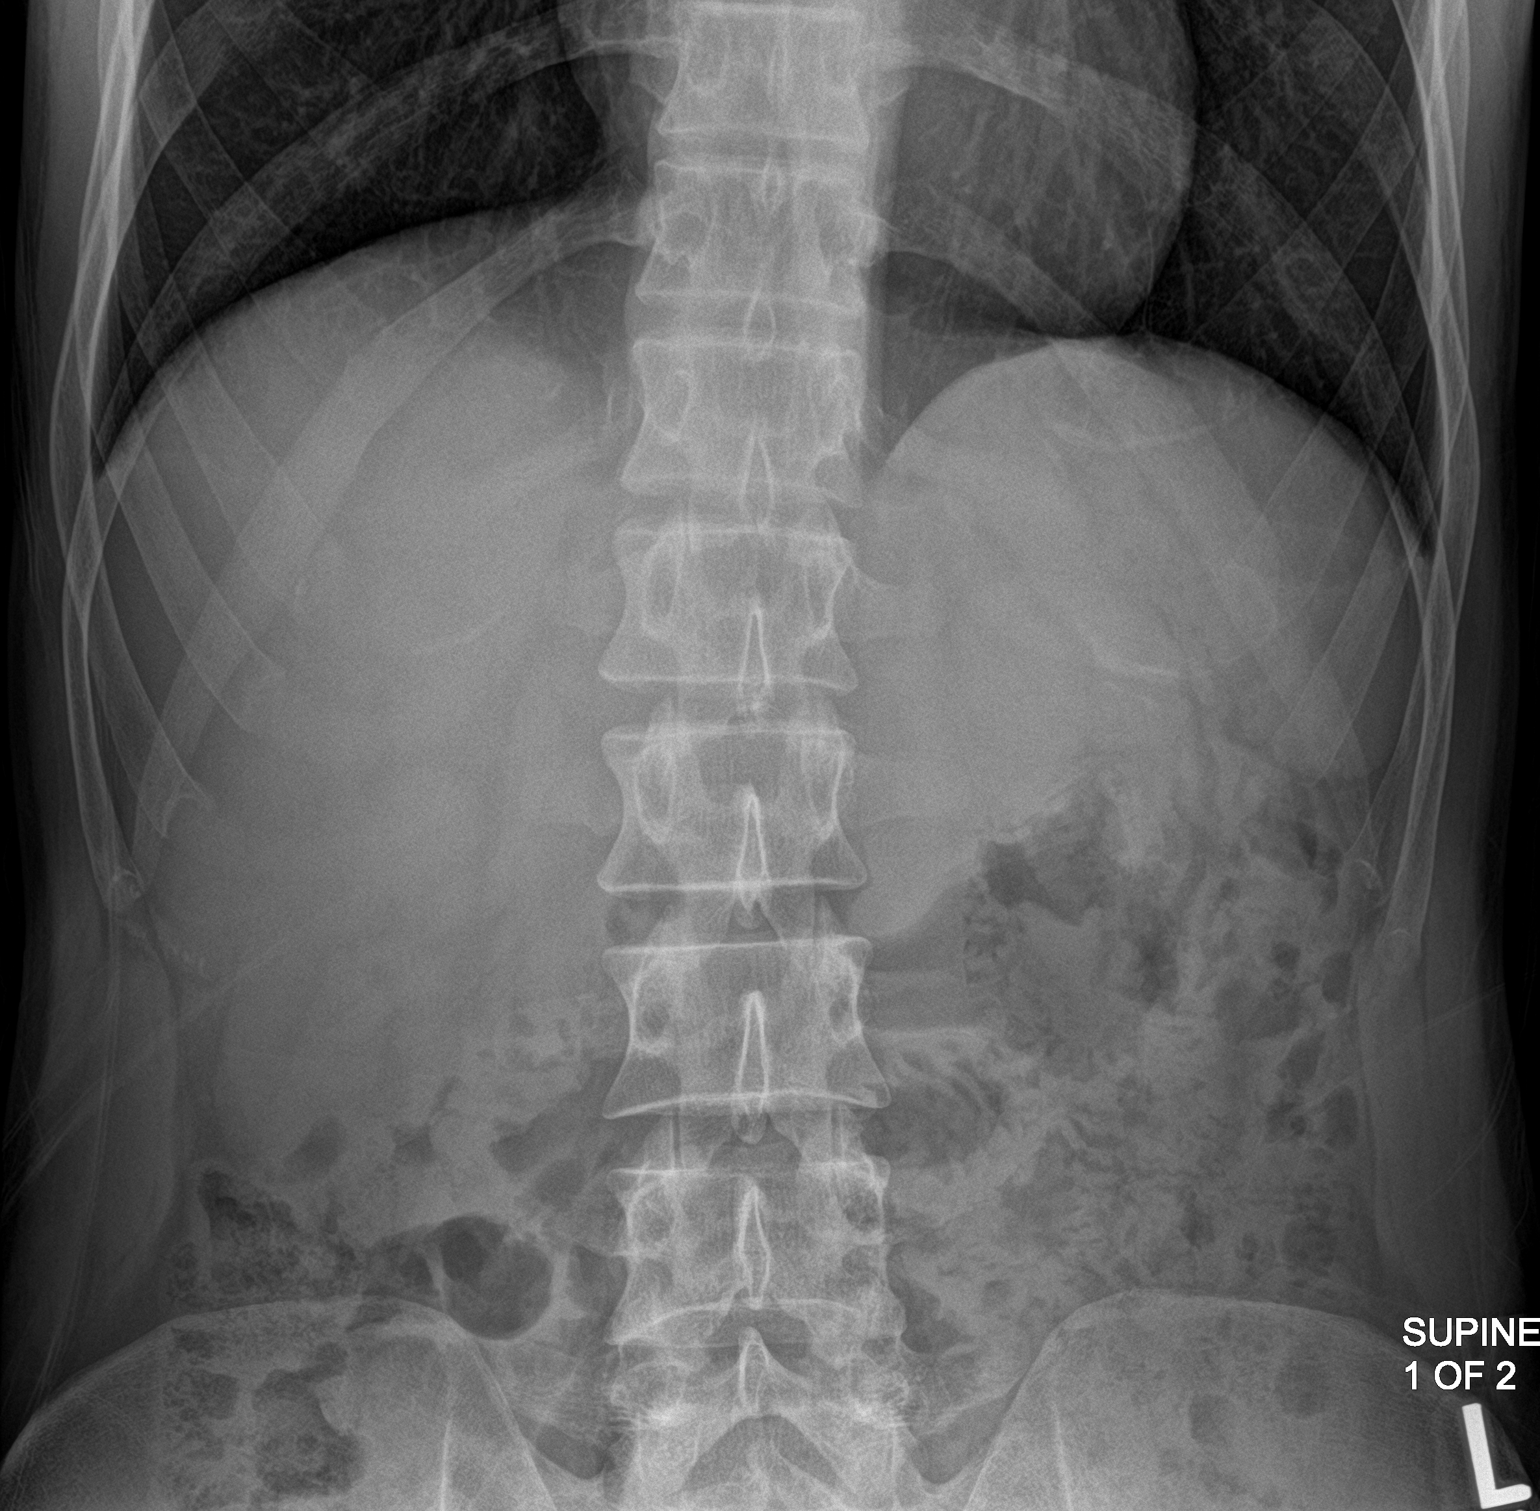

[abdomen kub (2 of 2)]
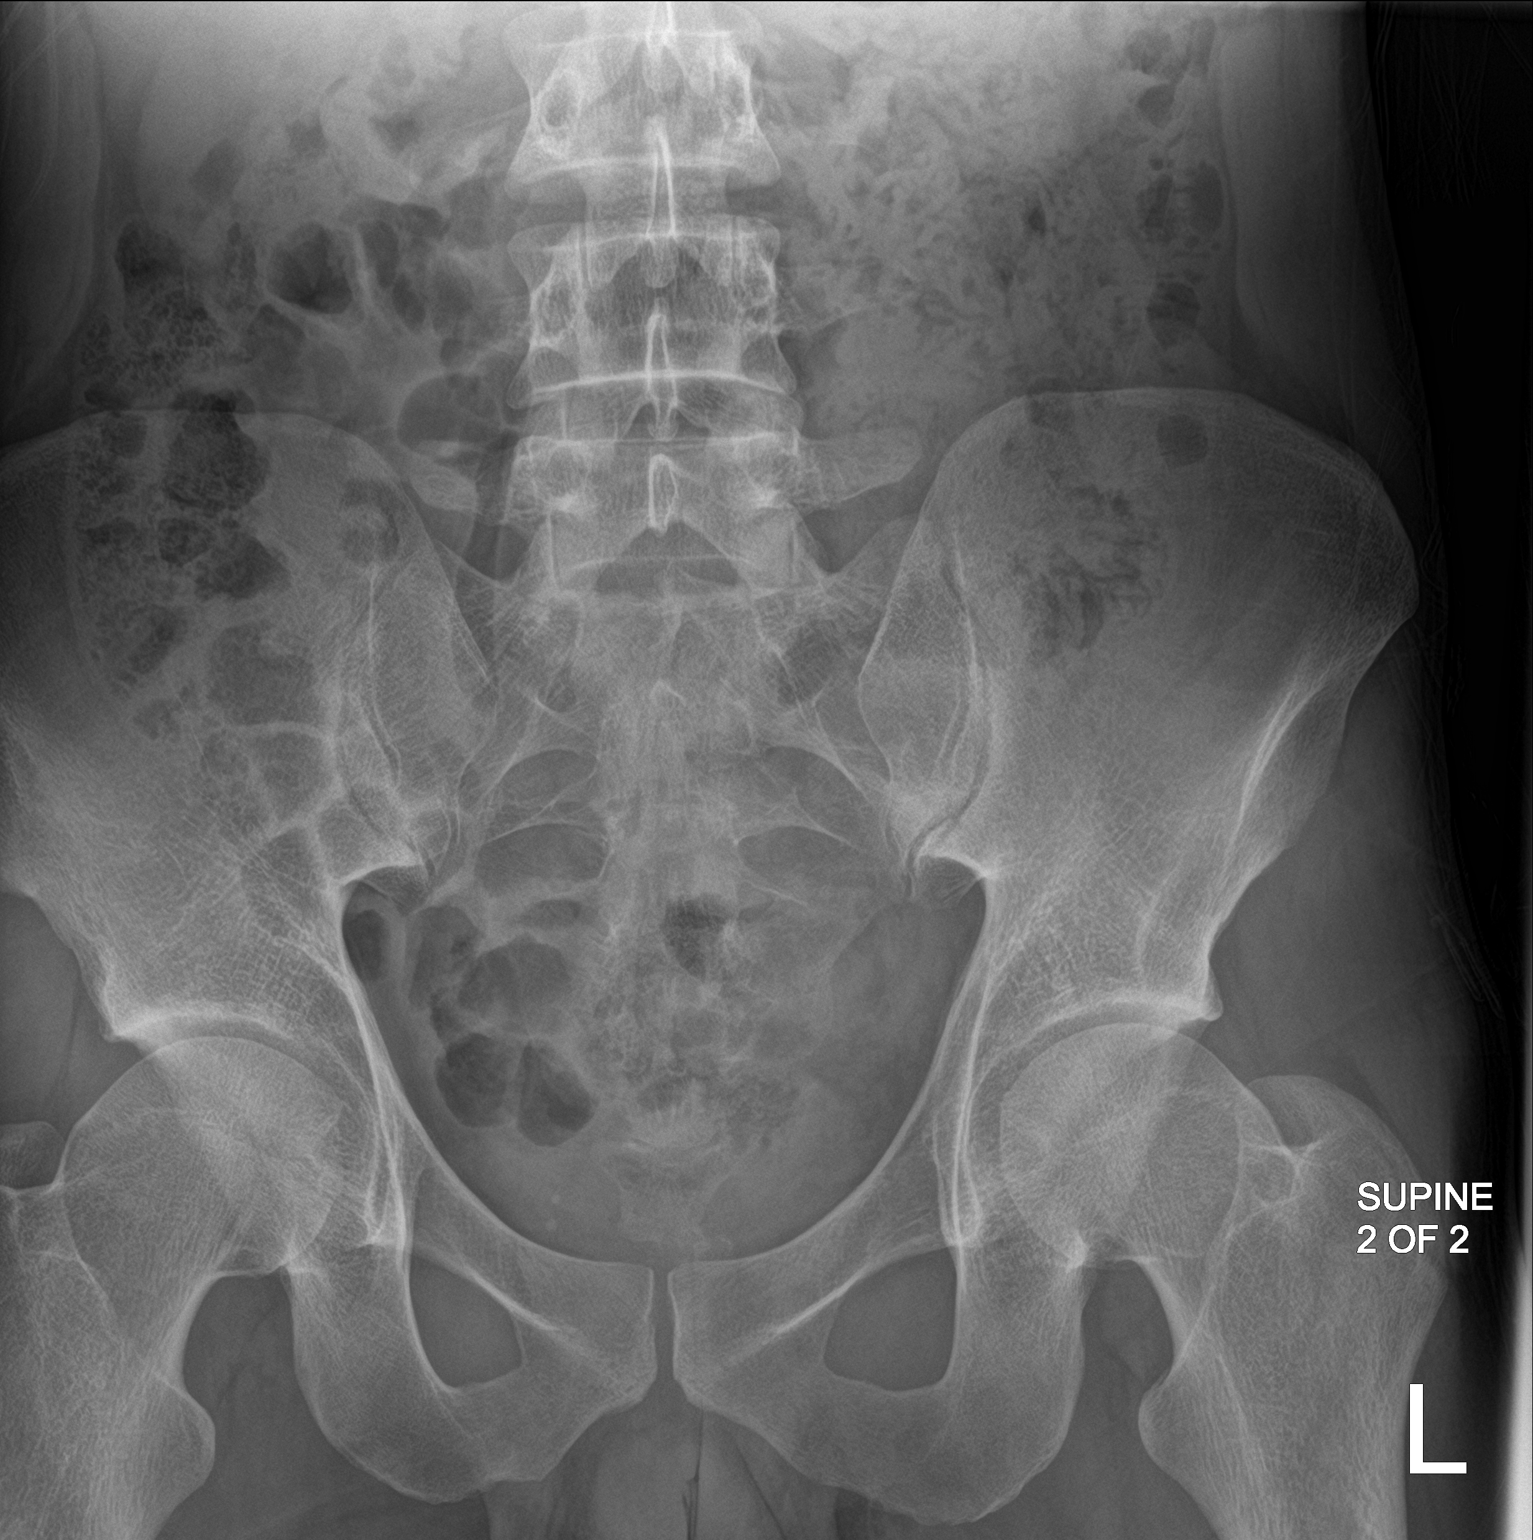

[2 of 2 positions shown; findings below may reference images not displayed]

FINDINGS: The bowel gas pattern is normal. No radio-opaque calculi or other
significant radiographic abnormality are seen. Two small phleboliths
project over the patient's right hemipelvis.
IMPRESSION: No radiopaque nephroliths identified.

## 2020-04-23 ENCOUNTER — Telehealth: Payer: Self-pay | Admitting: General Practice

## 2020-04-23 NOTE — Telephone Encounter (Signed)
Individual has been contacted 3+ times and each time the voicemail box has not been set up. No further attempts to contact individual will be made.
